# Patient Record
Sex: Male | Born: 1965 | Race: Black or African American | Hispanic: No | Marital: Married | Smoking: Never smoker
Health system: Southern US, Community
[De-identification: ages and names within clinical notes are randomized; demographics above are authoritative.]

## PROBLEM LIST (undated history)

## (undated) DIAGNOSIS — I10 Essential (primary) hypertension: Secondary | ICD-10-CM

## (undated) HISTORY — PX: TONSILLECTOMY: SUR1361

---

## 2010-02-14 ENCOUNTER — Ambulatory Visit (HOSPITAL_BASED_OUTPATIENT_CLINIC_OR_DEPARTMENT_OTHER): Admission: RE | Admit: 2010-02-14 | Discharge: 2010-02-14 | Payer: Self-pay | Admitting: Internal Medicine

## 2010-02-14 ENCOUNTER — Ambulatory Visit: Payer: Self-pay | Admitting: Diagnostic Radiology

## 2010-03-08 ENCOUNTER — Emergency Department (HOSPITAL_BASED_OUTPATIENT_CLINIC_OR_DEPARTMENT_OTHER): Admission: EM | Admit: 2010-03-08 | Discharge: 2010-03-08 | Payer: Self-pay | Admitting: Emergency Medicine

## 2012-09-28 ENCOUNTER — Emergency Department (HOSPITAL_BASED_OUTPATIENT_CLINIC_OR_DEPARTMENT_OTHER)
Admission: EM | Admit: 2012-09-28 | Discharge: 2012-09-28 | Disposition: A | Discharge status: Home / Self Care | Payer: Managed Care, Other (non HMO) | Attending: Emergency Medicine | Admitting: Emergency Medicine

## 2012-09-28 ENCOUNTER — Encounter (HOSPITAL_BASED_OUTPATIENT_CLINIC_OR_DEPARTMENT_OTHER): Payer: Self-pay | Admitting: *Deleted

## 2012-09-28 ENCOUNTER — Emergency Department (HOSPITAL_BASED_OUTPATIENT_CLINIC_OR_DEPARTMENT_OTHER): Payer: Managed Care, Other (non HMO)

## 2012-09-28 DIAGNOSIS — J3489 Other specified disorders of nose and nasal sinuses: Secondary | ICD-10-CM | POA: Insufficient documentation

## 2012-09-28 DIAGNOSIS — R059 Cough, unspecified: Secondary | ICD-10-CM | POA: Insufficient documentation

## 2012-09-28 DIAGNOSIS — I10 Essential (primary) hypertension: Secondary | ICD-10-CM | POA: Insufficient documentation

## 2012-09-28 DIAGNOSIS — J209 Acute bronchitis, unspecified: Secondary | ICD-10-CM | POA: Insufficient documentation

## 2012-09-28 DIAGNOSIS — J40 Bronchitis, not specified as acute or chronic: Secondary | ICD-10-CM

## 2012-09-28 DIAGNOSIS — R05 Cough: Secondary | ICD-10-CM | POA: Insufficient documentation

## 2012-09-28 HISTORY — DX: Essential (primary) hypertension: I10

## 2012-09-28 MED ORDER — ALBUTEROL SULFATE HFA 108 (90 BASE) MCG/ACT IN AERS: 1.0000 | INHALATION_SPRAY | Freq: Four times a day (QID) | RESPIRATORY_TRACT | Status: AC | PRN

## 2012-09-28 MED ORDER — PREDNISONE 10 MG PO TABS: 20.0000 mg | ORAL_TABLET | Freq: Every day | ORAL | Status: DC

## 2012-09-28 MED ORDER — AZITHROMYCIN 250 MG PO TABS: ORAL_TABLET | ORAL | Status: DC

## 2012-09-28 NOTE — ED Provider Notes (Signed)
History     CSN: [id number]  Arrival date & time 09/28/12  1003   First MD Initiated Contact with Patient 09/28/12 1050      Chief Complaint  Patient presents with  . URI    (Consider location/radiation/quality/duration/timing/severity/associated sxs/prior treatment) Patient is a 47 y.o. male presenting with URI. The history is provided by the patient.  URI  patient here with cough and congestion x1 week. Cough has been nonproductive. No associated fever, vomiting, diarrhea. Patient has used over-the-counter medications without relief. No recent rashes. Denies any neck pain photophobia. No severe dyspnea. Symptoms have been persistent and nothing makes them better or worse Past Medical History  Diagnosis Date  . Hypertension     Past Surgical History  Procedure Laterality Date  . Tonsillectomy      No family history on file.  History  Substance Use Topics  . Smoking status: Never Smoker   . Smokeless tobacco: Not on file  . Alcohol Use: Yes      Review of Systems  All other systems reviewed and are negative.    Allergies  Review of patient's allergies indicates no known allergies.  Home Medications   Current Outpatient Rx  Name  Route  Sig  Dispense  Refill  . UNKNOWN TO PATIENT      Blood pressure med           BP 167/98  Pulse 75  Temp(Src) 98.3 F (36.8 C) (Oral)  Resp 20  SpO2 100%  Physical Exam  Nursing note and vitals reviewed. Constitutional: He is oriented to person, place, and time. He appears well-developed and well-nourished.  Non-toxic appearance. No distress.  HENT:  Head: Normocephalic and atraumatic.  Eyes: Conjunctivae, EOM and lids are normal. Pupils are equal, round, and reactive to light.  Neck: Normal range of motion. Neck supple. No tracheal deviation present. No mass present.  Cardiovascular: Normal rate, regular rhythm and normal heart sounds.  Exam reveals no gallop.   No murmur heard. Pulmonary/Chest: Effort normal  and breath sounds normal. No stridor. No respiratory distress. He has no decreased breath sounds. He has no wheezes. He has no rhonchi. He has no rales.  Abdominal: Soft. Normal appearance and bowel sounds are normal. He exhibits no distension. There is no tenderness. There is no rebound and no CVA tenderness.  Musculoskeletal: Normal range of motion. He exhibits no edema and no tenderness.  Neurological: He is alert and oriented to person, place, and time. He has normal strength. No cranial nerve deficit or sensory deficit. GCS eye subscore is 4. GCS verbal subscore is 5. GCS motor subscore is 6.  Skin: Skin is warm and dry. No abrasion and no rash noted.  Psychiatric: He has a normal mood and affect. His speech is normal and behavior is normal.    ED Course  Procedures (including critical care time)  Labs Reviewed - No data to display No results found.   No diagnosis found.    MDM  Patient to be treated for bronchitis        Toy Baker, MD 09/28/12 1214

## 2012-09-28 NOTE — ED Notes (Signed)
Cold symptoms for past week, productive cough, L side of neck tender to touch, otc meds & nyquil not helping

## 2013-04-13 IMAGING — CR DG CHEST 2V
2 series · 2 of 2 positions shown · non-contrast
Comparison: None.

CLINICAL DATA: Cough, congestion, chest pain

CHEST - 2 VIEW

[w chest pa]
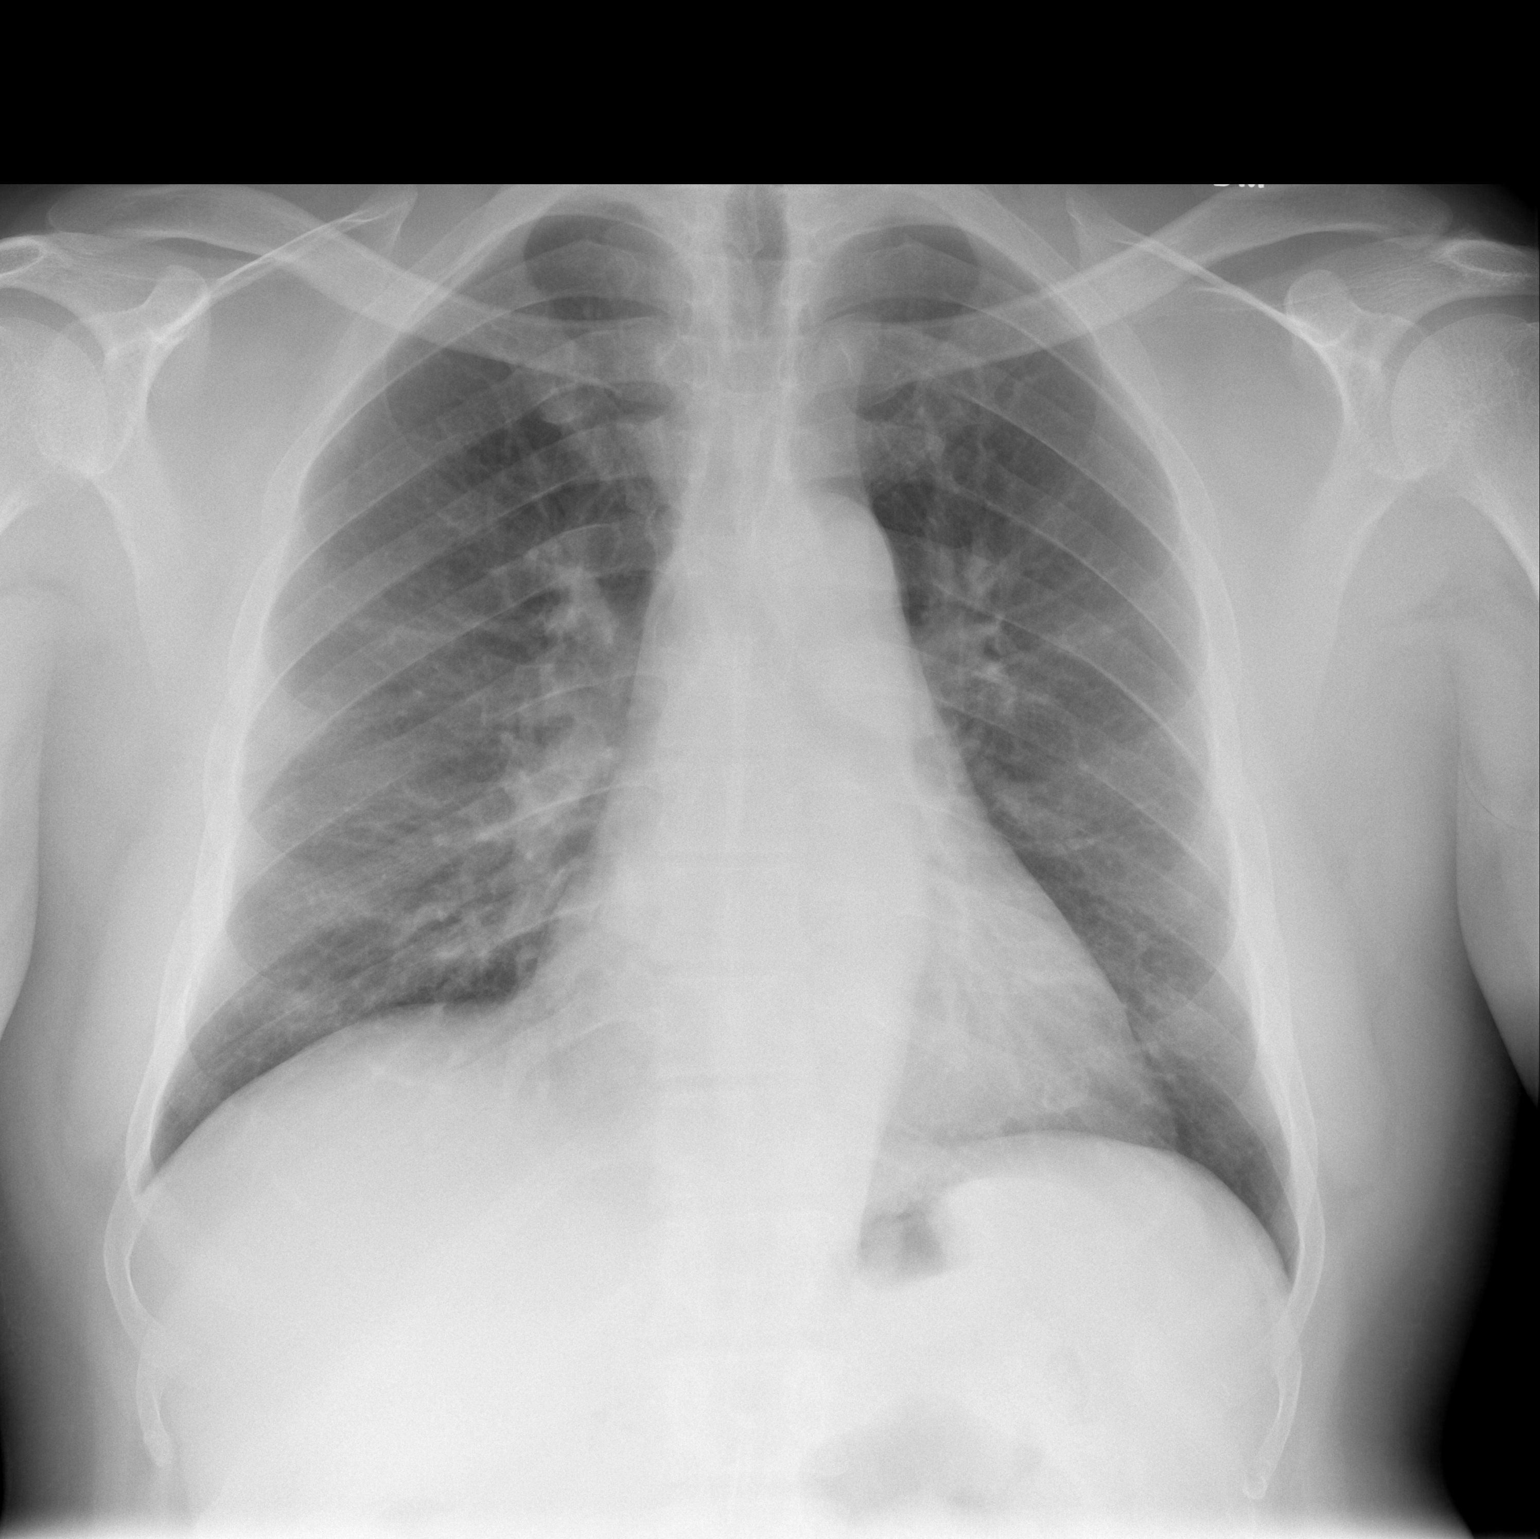

[w chest lat]
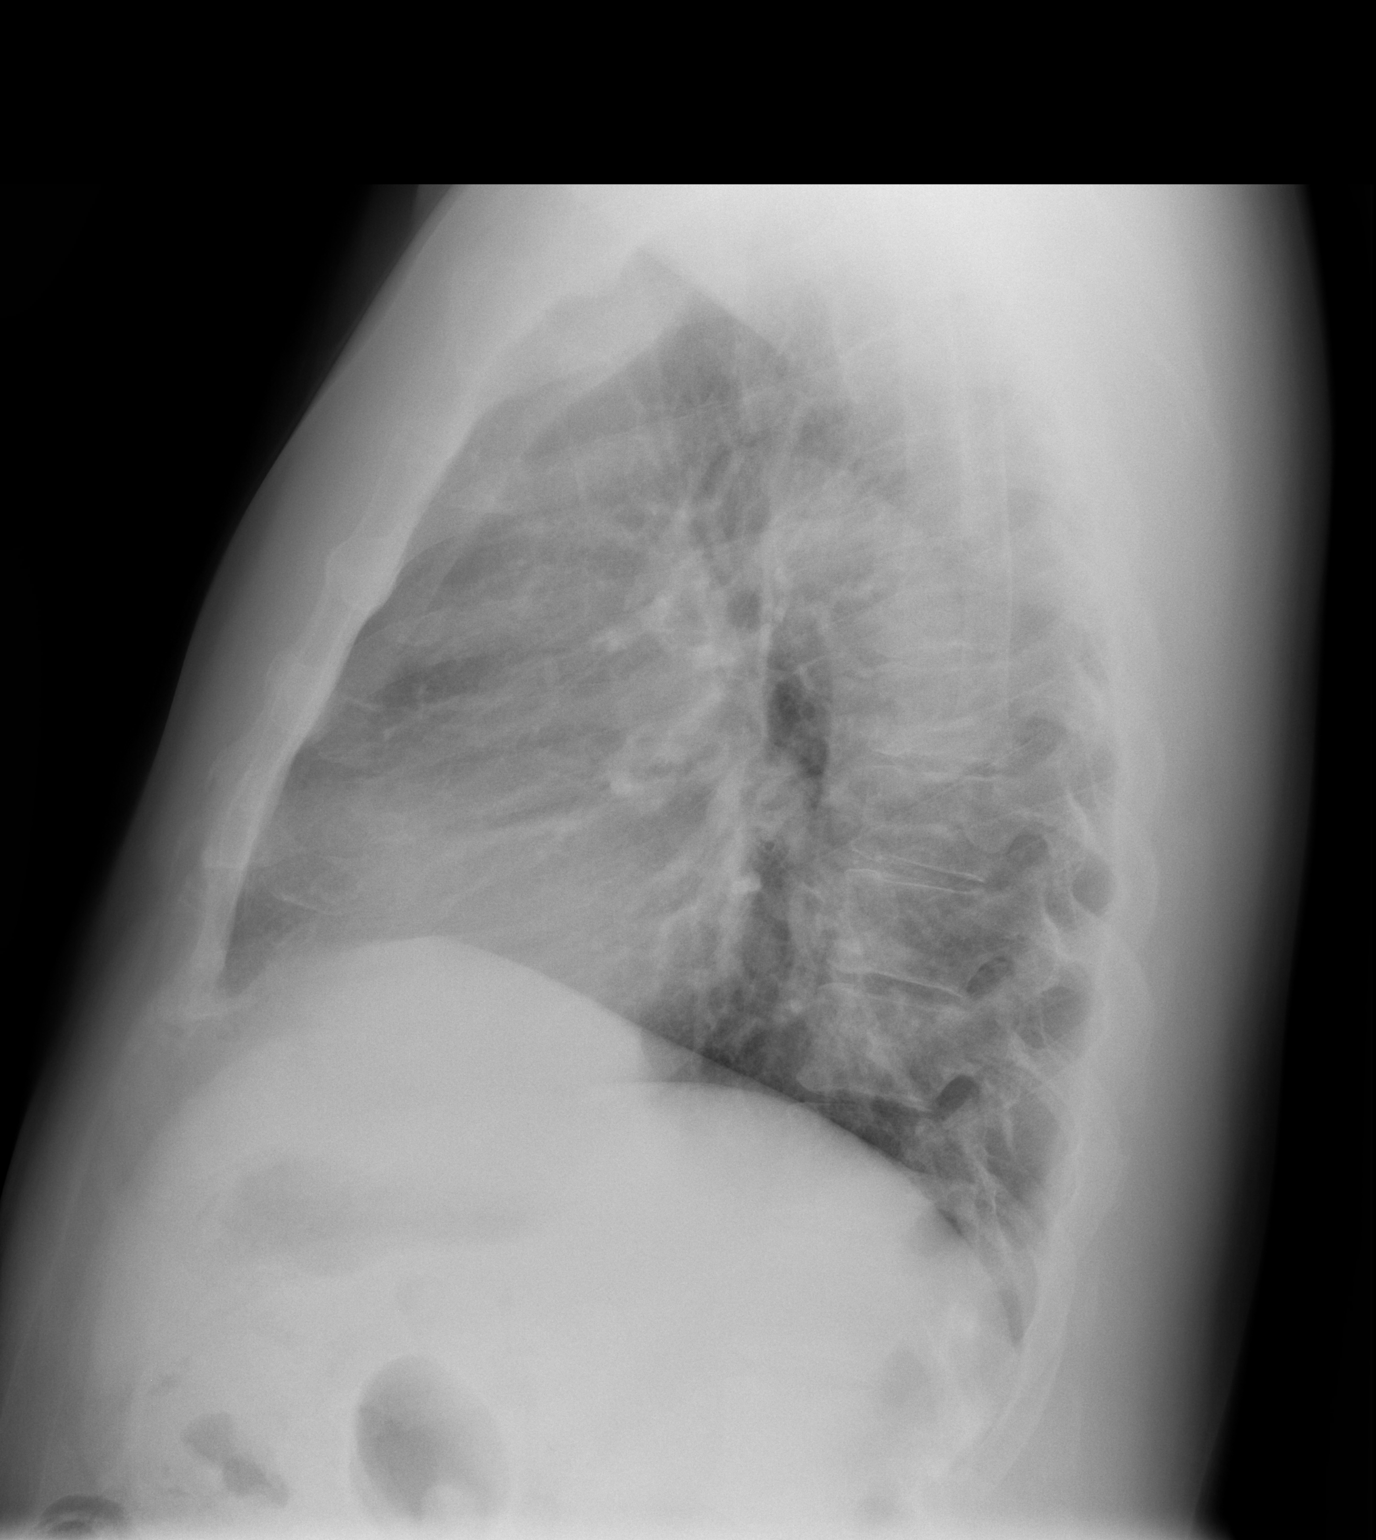

[2 of 2 positions shown; findings below may reference images not displayed]

FINDINGS: Increased interstitial markings/bronchitic changes.  No
focal consolidation. No pleural effusion or pneumothorax.

The heart is normal in size.

Visualized osseous structures are within normal limits.
IMPRESSION: Bronchitic changes.

## 2013-09-01 ENCOUNTER — Encounter (HOSPITAL_BASED_OUTPATIENT_CLINIC_OR_DEPARTMENT_OTHER): Payer: Self-pay | Admitting: Emergency Medicine

## 2013-09-01 ENCOUNTER — Emergency Department (HOSPITAL_BASED_OUTPATIENT_CLINIC_OR_DEPARTMENT_OTHER)
Admission: EM | Admit: 2013-09-01 | Discharge: 2013-09-01 | Disposition: A | Discharge status: Home / Self Care | Payer: Self-pay | Attending: Emergency Medicine | Admitting: Emergency Medicine

## 2013-09-01 ENCOUNTER — Emergency Department (HOSPITAL_BASED_OUTPATIENT_CLINIC_OR_DEPARTMENT_OTHER): Payer: Self-pay

## 2013-09-01 DIAGNOSIS — Z792 Long term (current) use of antibiotics: Secondary | ICD-10-CM | POA: Insufficient documentation

## 2013-09-01 DIAGNOSIS — R0982 Postnasal drip: Secondary | ICD-10-CM | POA: Insufficient documentation

## 2013-09-01 DIAGNOSIS — Z79899 Other long term (current) drug therapy: Secondary | ICD-10-CM | POA: Insufficient documentation

## 2013-09-01 DIAGNOSIS — J3489 Other specified disorders of nose and nasal sinuses: Secondary | ICD-10-CM | POA: Insufficient documentation

## 2013-09-01 DIAGNOSIS — R5381 Other malaise: Secondary | ICD-10-CM | POA: Insufficient documentation

## 2013-09-01 DIAGNOSIS — R5383 Other fatigue: Secondary | ICD-10-CM

## 2013-09-01 DIAGNOSIS — IMO0002 Reserved for concepts with insufficient information to code with codable children: Secondary | ICD-10-CM | POA: Insufficient documentation

## 2013-09-01 DIAGNOSIS — I10 Essential (primary) hypertension: Secondary | ICD-10-CM | POA: Insufficient documentation

## 2013-09-01 DIAGNOSIS — J40 Bronchitis, not specified as acute or chronic: Secondary | ICD-10-CM | POA: Insufficient documentation

## 2013-09-01 MED ORDER — HYDROCODONE-HOMATROPINE 5-1.5 MG/5ML PO SYRP: 5.0000 mL | ORAL_SOLUTION | Freq: Four times a day (QID) | ORAL | Status: DC | PRN

## 2013-09-01 NOTE — ED Notes (Signed)
Pt amb to room 9 with quick steady gait in nad. Pt reports cough x 1 week producing yellow sputum, pt states he has chest "soreness, only when I cough..." pt initially declines ekg, after encouragement allows ekg while being triaged. Pt denies any cp at this time, states "It's only when I cough".

## 2013-09-01 NOTE — Discharge Instructions (Signed)

## 2013-09-01 NOTE — ED Provider Notes (Signed)
CSN: [id number]     Arrival date & time 09/01/13  1141 History   First MD Initiated Contact with Patient 09/01/13 1201     Chief Complaint  Patient presents with  . chest "sore with coughing only"      (Consider location/radiation/quality/duration/timing/severity/associated sxs/prior Treatment) HPI Comments: Patient presents with cough. He states about a week ago he started having some runny nose and nasal congestion. He states it's moved down into his chest. He has a productive cough. He denies any shortness of breath. He does have some pain to the Center of his chest only with coughing. He denies a shortness of breath. He denies any fevers or chills. He denies any nausea vomiting or diarrhea. He denies a history of underlying lung disease.   Past Medical History  Diagnosis Date  . Hypertension    Past Surgical History  Procedure Laterality Date  . Tonsillectomy     History reviewed. No pertinent family history. History  Substance Use Topics  . Smoking status: Never Smoker   . Smokeless tobacco: Not on file  . Alcohol Use: Yes    Review of Systems  Constitutional: Positive for fatigue. Negative for fever, chills and diaphoresis.  HENT: Positive for congestion, postnasal drip and rhinorrhea. Negative for sneezing.   Eyes: Negative.   Respiratory: Positive for cough. Negative for chest tightness and shortness of breath.   Cardiovascular: Negative for chest pain and leg swelling.  Gastrointestinal: Negative for nausea, vomiting, abdominal pain, diarrhea and blood in stool.  Genitourinary: Negative for frequency, hematuria, flank pain and difficulty urinating.  Musculoskeletal: Negative for arthralgias and back pain.  Skin: Negative for rash.  Neurological: Negative for dizziness, speech difficulty, weakness, numbness and headaches.      Allergies  Review of patient's allergies indicates no known allergies.  Home Medications   Current Outpatient Rx  Name  Route  Sig   Dispense  Refill  . albuterol (PROVENTIL HFA;VENTOLIN HFA) 108 (90 BASE) MCG/ACT inhaler   Inhalation   Inhale 1-2 puffs into the lungs every 6 (six) hours as needed for wheezing.   1 Inhaler   0   . azithromycin (ZITHROMAX Z-PAK) 250 MG tablet      2 by mouth daily x1 then 1 by mouth daily x4 days   6 each   0   . HYDROcodone-homatropine (HYCODAN) 5-1.5 MG/5ML syrup   Oral   Take 5 mLs by mouth every 6 (six) hours as needed for cough.   120 mL   0   . predniSONE (DELTASONE) 10 MG tablet   Oral   Take 2 tablets (20 mg total) by mouth daily.   10 tablet   0   . UNKNOWN TO PATIENT      Blood pressure med          BP 153/99  Pulse 92  Temp(Src) 98.4 F (36.9 C) (Oral)  Resp 20  Ht 6\' 4"  (1.93 m)  Wt 245 lb (111.131 kg)  BMI 29.83 kg/m2  SpO2 100% Physical Exam  Constitutional: He is oriented to person, place, and time. He appears well-developed and well-nourished.  HENT:  Head: Normocephalic and atraumatic.  Eyes: Pupils are equal, round, and reactive to light.  Neck: Normal range of motion. Neck supple.  Cardiovascular: Normal rate, regular rhythm and normal heart sounds.   Pulmonary/Chest: Effort normal and breath sounds normal. No respiratory distress. He has no wheezes. He has no rales. He exhibits no tenderness.  Abdominal: Soft. Bowel sounds are normal.  There is no tenderness. There is no rebound and no guarding.  Musculoskeletal: Normal range of motion. He exhibits no edema.  Lymphadenopathy:    He has no cervical adenopathy.  Neurological: He is alert and oriented to person, place, and time.  Skin: Skin is warm and dry. No rash noted.  Psychiatric: He has a normal mood and affect.    ED Course  Procedures (including critical care time) Labs Review Labs Reviewed - No data to display Imaging Review Dg Chest 2 View  09/01/2013   CLINICAL DATA:  Cough and congestion  EXAM: CHEST  2 VIEW  COMPARISON:  09/28/2012  FINDINGS: Cardiac shadow is stable. The  lungs are well aerated bilaterally. Mild bronchitic changes are again identified. No focal confluent infiltrate is seen. No acute bony abnormality is noted.  IMPRESSION: Bronchitic changes.  No focal infiltrate is seen.   Electronically Signed   By: Alcide CleverMark  Lukens M.D.   On: 09/01/2013 12:41    EKG Interpretation    Date/Time:  Monday September 01 2013 11:53:40 EST Ventricular Rate:  86 PR Interval:  160 QRS Duration: 86 QT Interval:  368 QTC Calculation: 440 R Axis:   45 Text Interpretation:  Normal sinus rhythm Normal ECG No old tracing to compare Confirmed by Nyja Westbrook  MD, Karmella Bouvier ([id number]) on 09/01/2013 12:13:52 PM            MDM   Final diagnoses:  Bronchitis    Patient presents with one-week history of cough. His lungs are clear. There is no evidence of pneumonia on exam her chest x-ray. He's afebrile. He has normal oxygen saturation. He was given a prescription for Hycodan cough syrup and encouraged to followup with her primary care physician if his symptoms are not improving. He was given a resource guide for possible followup.    Rolan BuccoMelanie Adolfo Granieri, MD 09/01/13 1314

## 2014-03-17 IMAGING — CR DG CHEST 2V
2 series · 2 of 2 positions shown · non-contrast
Comparison: 09/28/2012

CLINICAL DATA: Cough and congestion

EXAM:
CHEST  2 VIEW

[w chest pa]
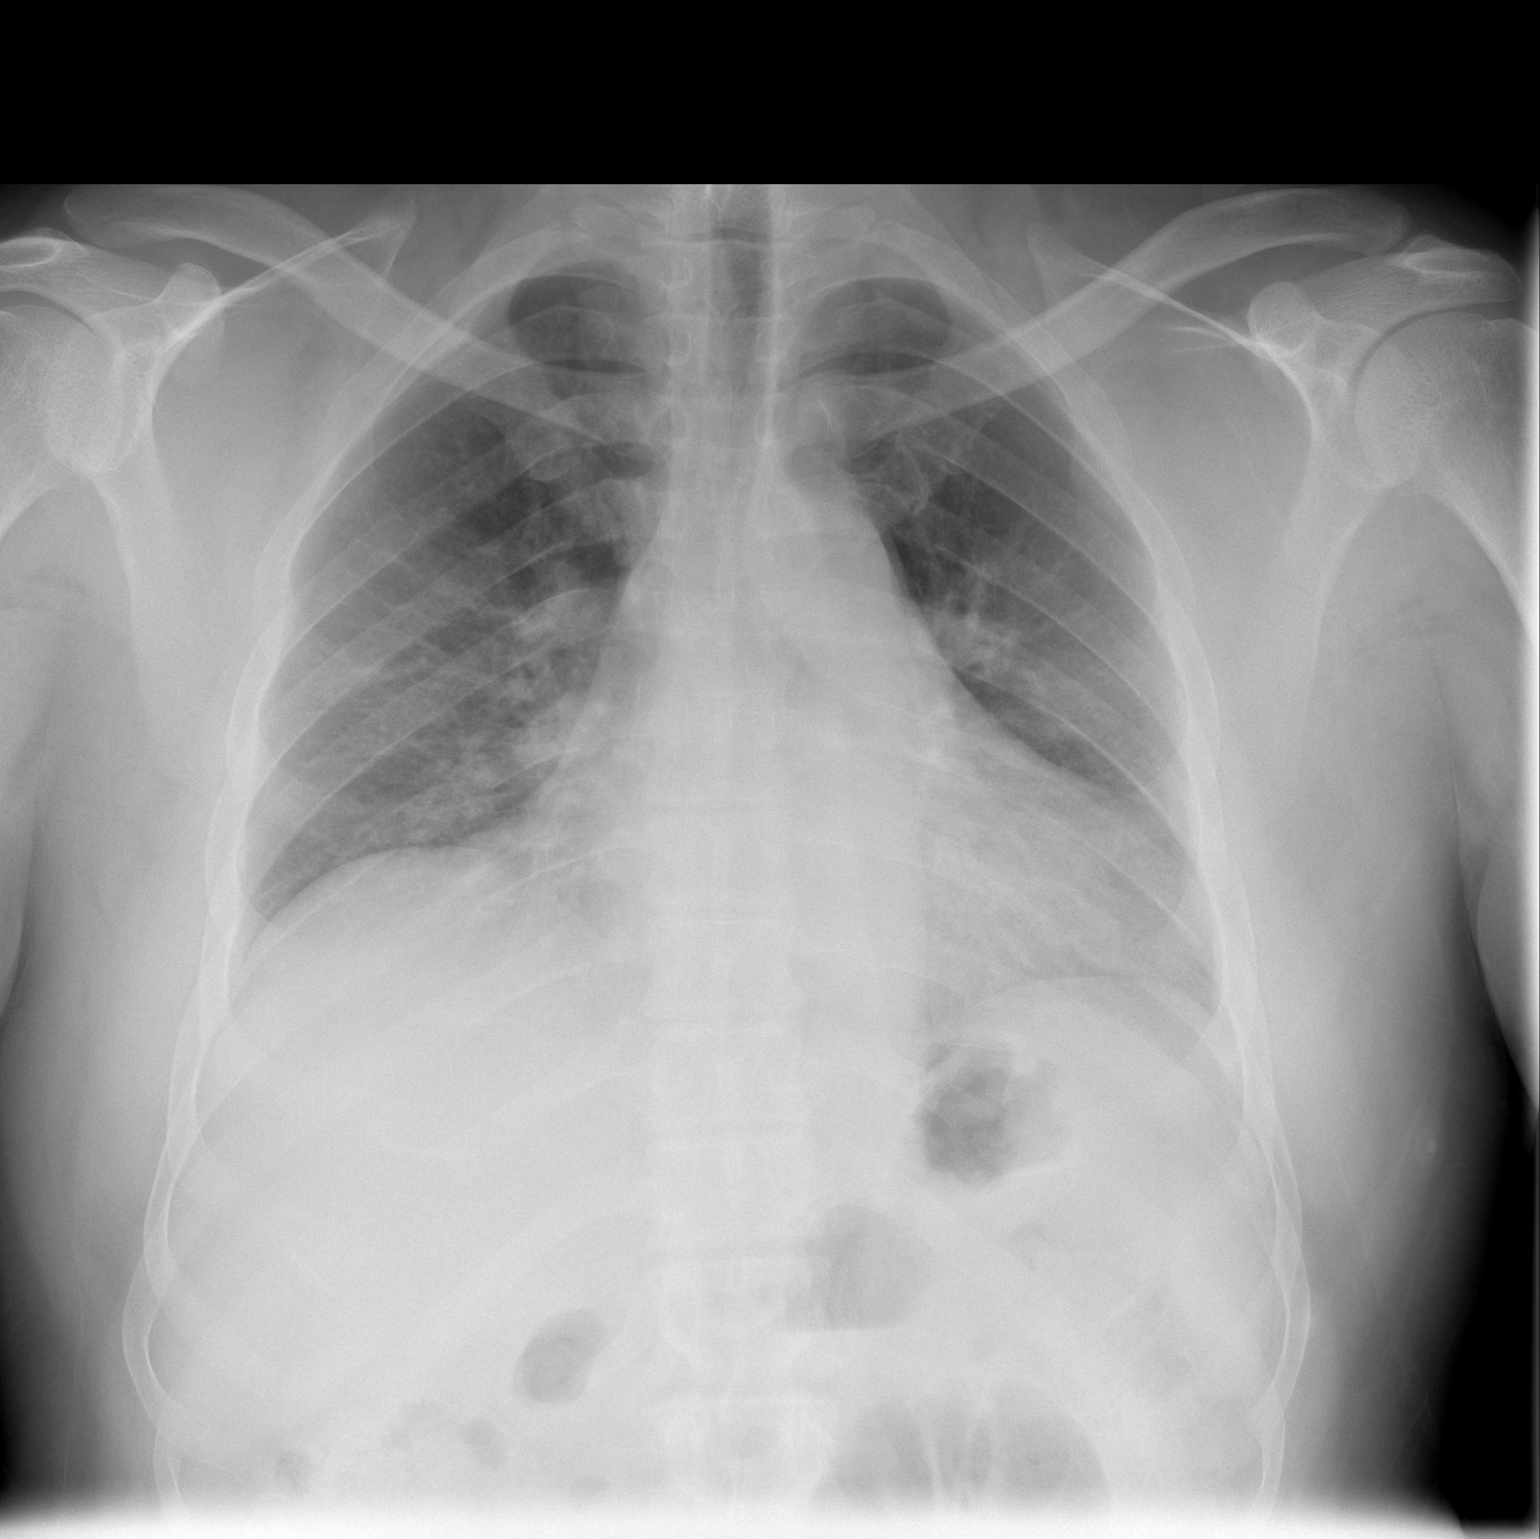

[w chest lat]
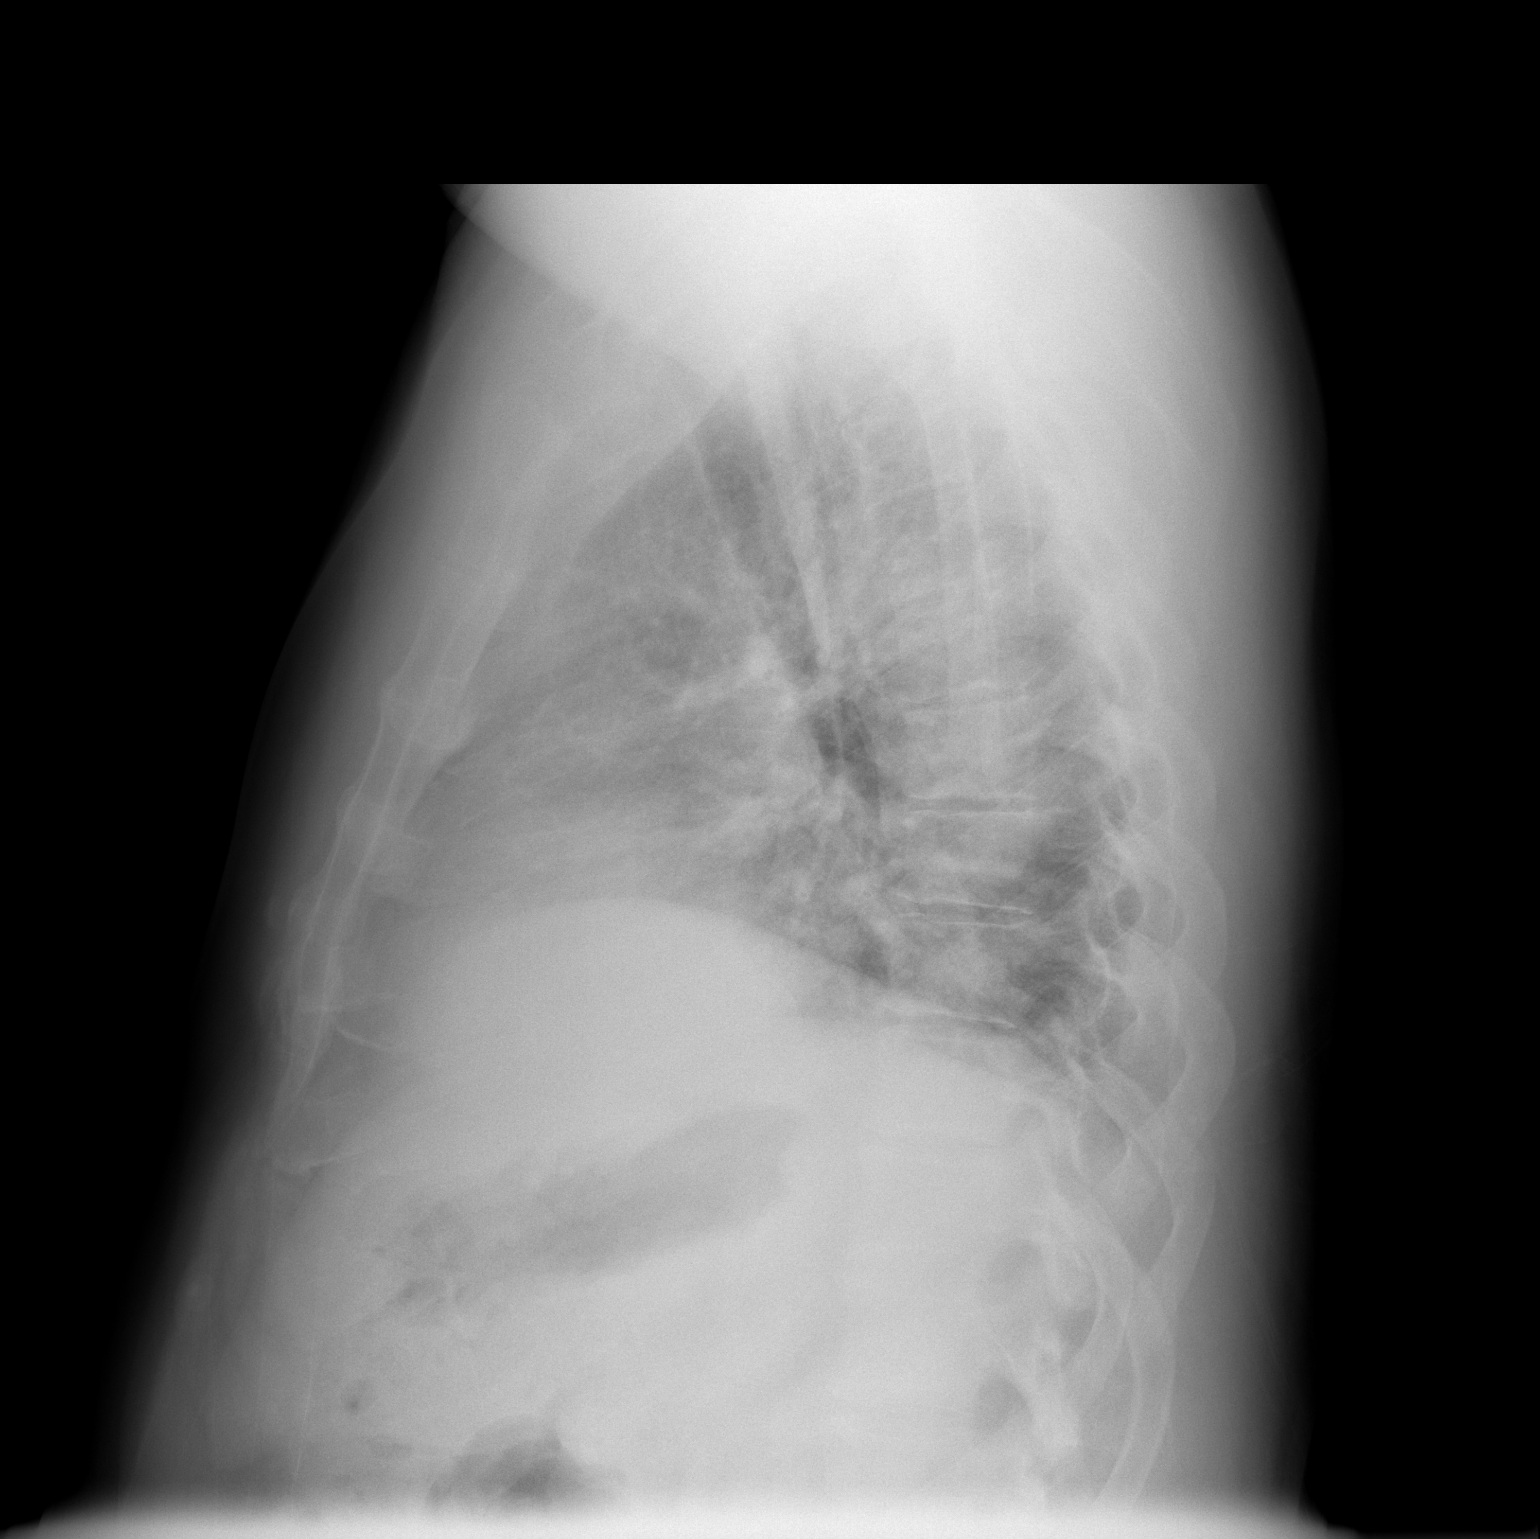

[2 of 2 positions shown; findings below may reference images not displayed]

FINDINGS: Cardiac shadow is stable. The lungs are well aerated bilaterally.
Mild bronchitic changes are again identified. No focal confluent
infiltrate is seen. No acute bony abnormality is noted.
IMPRESSION: Bronchitic changes.  No focal infiltrate is seen.

## 2018-05-06 ENCOUNTER — Other Ambulatory Visit: Payer: Self-pay

## 2018-05-06 ENCOUNTER — Emergency Department (HOSPITAL_BASED_OUTPATIENT_CLINIC_OR_DEPARTMENT_OTHER)
Admission: EM | Admit: 2018-05-06 | Discharge: 2018-05-06 | Disposition: A | Discharge status: Home / Self Care | Payer: PRIVATE HEALTH INSURANCE | Attending: Emergency Medicine | Admitting: Emergency Medicine

## 2018-05-06 ENCOUNTER — Encounter (HOSPITAL_BASED_OUTPATIENT_CLINIC_OR_DEPARTMENT_OTHER): Payer: Self-pay

## 2018-05-06 DIAGNOSIS — G510 Bell's palsy: Secondary | ICD-10-CM | POA: Diagnosis not present

## 2018-05-06 DIAGNOSIS — R29810 Facial weakness: Secondary | ICD-10-CM | POA: Diagnosis present

## 2018-05-06 DIAGNOSIS — I10 Essential (primary) hypertension: Secondary | ICD-10-CM | POA: Insufficient documentation

## 2018-05-06 MED ORDER — PREDNISONE 20 MG PO TABS: 40.0000 mg | ORAL_TABLET | Freq: Every day | ORAL | 0 refills | Status: DC

## 2018-05-06 MED ORDER — VALACYCLOVIR HCL 1 G PO TABS: 1000.0000 mg | ORAL_TABLET | Freq: Three times a day (TID) | ORAL | 0 refills | Status: DC

## 2018-05-06 NOTE — Discharge Instructions (Signed)
Make sure you are filing a strict diabetic diet as the prednisone will increase your blood sugar.  If you develop any symptoms of numbness or weakness 10 arm or leg, difficulty speaking or walking return immediately

## 2018-05-06 NOTE — ED Notes (Signed)
Right facial droop. Right eyebrow droop with raising eye brows.  Pain free . Steady gait. Speech clear.

## 2018-05-06 NOTE — ED Notes (Signed)
ED Provider at bedside. 

## 2018-05-06 NOTE — ED Provider Notes (Signed)
[facility] EMERGENCY DEPARTMENT Provider Note   CSN: [id number] Arrival date & time: 05/06/18  1846     History   Chief Complaint Chief Complaint  Patient presents with  . Facial Droop    HPI Jeffery Schroeder is a 52 y.o. male.  Patient is a 52 year old male with a history of hypertension, borderline diabetes on metformin presenting today with a strange feeling in his face and weakness.  States yesterday he had a weird tingling sensation in the right side of his face and an unusual taste in his mouth.  At this morning when he woke up he noticed his face was drooping.  When he smiles only the left corner of his mouth moves.  He denies any aphasia, numbness or tingling in the arm or leg unilaterally.  He has had no dizziness or difficulty ambulating.  Last week had URI sx but have improved.  No other illnesses.  The history is provided by the patient.    Past Medical History:  Diagnosis Date  . Hypertension     There are no active problems to display for this patient.   Past Surgical History:  Procedure Laterality Date  . TONSILLECTOMY          Home Medications    Prior to Admission medications   Medication Sig Start Date End Date Taking? Authorizing Provider  albuterol (PROVENTIL HFA;VENTOLIN HFA) 108 (90 BASE) MCG/ACT inhaler Inhale 1-2 puffs into the lungs every 6 (six) hours as needed for wheezing. 09/28/12   Lorre Nick, MD  azithromycin (ZITHROMAX Z-PAK) 250 MG tablet 2 by mouth daily x1 then 1 by mouth daily x4 days 09/28/12   Lorre Nick, MD  HYDROcodone-homatropine [facility]) 5-1.5 MG/5ML syrup Take 5 mLs by mouth every 6 (six) hours as needed for cough. 09/01/13   Rolan Bucco, MD  predniSONE (DELTASONE) 20 MG tablet Take 2 tablets (40 mg total) by mouth daily. 05/06/18   Gwyneth Sprout, MD  UNKNOWN TO PATIENT Blood pressure med    [provider]  valACYclovir (VALTREX) 1000 MG tablet Take 1 tablet (1,000 mg total) by mouth 3 (three) times  daily. 05/06/18   Gwyneth Sprout, MD    Family History No family history on file.  Social History Social History   Tobacco Use  . Smoking status: Never Smoker  . Smokeless tobacco: Never Used  Substance Use Topics  . Alcohol use: Yes    Comment: occ  . Drug use: No     Allergies   Patient has no known allergies.   Review of Systems Review of Systems  All other systems reviewed and are negative.    Physical Exam Updated Vital Signs BP (!) 145/74   Pulse 91   Temp 97.8 F (36.6 C) (Oral)   Resp (!) 21   SpO2 99%   Physical Exam  Constitutional: He is oriented to person, place, and time. He appears well-developed and well-nourished. No distress.  HENT:  Head: Normocephalic and atraumatic.  Eyes: Pupils are equal, round, and reactive to light. EOM are normal.  Neck: Normal range of motion.  Cardiovascular: Normal rate, regular rhythm, normal heart sounds and intact distal pulses.  No murmur heard. Pulmonary/Chest: Effort normal and breath sounds normal.  Abdominal: Soft. He exhibits no distension and no mass. There is no tenderness. There is no guarding.  Musculoskeletal: He exhibits no edema or tenderness.  Neurological: He is alert and oriented to person, place, and time. He has normal strength. No sensory deficit. Coordination  and gait normal.  Right sided facial droop with forehead involvement.  No sensory deficits.  Normal tongue movement and sensation.  No vesicular rashes.  Eye is able to completely close but mild weakness of right eyelid.  Skin: Skin is warm. Capillary refill takes less than 2 seconds.  Psychiatric: He has a normal mood and affect. His behavior is normal.  Nursing note and vitals reviewed.    ED Treatments / Results  Labs (all labs ordered are listed, but only abnormal results are displayed) Labs Reviewed - No data to display  EKG None  Radiology No results found.  Procedures Procedures (including critical care  time)  Medications Ordered in ED Medications - No data to display   Initial Impression / Assessment and Plan / ED Course  I have reviewed the triage vital signs and the nursing notes.  Pertinent labs & imaging results that were available during my care of the patient were reviewed by me and considered in my medical decision making (see chart for details).     Patient presenting today with right-sided facial droop most consistent with Bell's palsy.  Patient is otherwise well-appearing.  No other neurologic findings.  Low suspicion for stroke at this time.  Findings discussed with the patient and his wife.  Will start on prednisone and Valtrex.  Patient had last creatinine a year ago and it was 1.1.  He is diabetic and takes metformin and stressed to him following a strict diabetic diet drinking plenty of water and knowing that his blood sugar would be elevated based on the prednisone.  Recommended he follow-up with his doctor in 1 week  Final Clinical Impressions(s) / ED Diagnoses   Final diagnoses:  Bell's palsy    ED Discharge Orders         Ordered    predniSONE (DELTASONE) 20 MG tablet  Daily     05/06/18 1943    valACYclovir (VALTREX) 1000 MG tablet  3 times daily     05/06/18 1943           Gwyneth Sprout, MD 05/06/18 2300

## 2018-05-06 NOTE — ED Triage Notes (Signed)
Pt woke this am 6am with facial droop -states "mouth felt funny yesterday"-NAD-steady gait

## 2019-03-15 ENCOUNTER — Emergency Department (HOSPITAL_COMMUNITY): Payer: PRIVATE HEALTH INSURANCE

## 2019-03-15 ENCOUNTER — Encounter (HOSPITAL_COMMUNITY): Payer: Self-pay | Admitting: Emergency Medicine

## 2019-03-15 ENCOUNTER — Emergency Department (HOSPITAL_COMMUNITY)
Admission: EM | Admit: 2019-03-15 | Discharge: 2019-03-15 | Disposition: A | Discharge status: Home / Self Care | Payer: PRIVATE HEALTH INSURANCE | Attending: Emergency Medicine | Admitting: Emergency Medicine

## 2019-03-15 ENCOUNTER — Other Ambulatory Visit: Payer: Self-pay

## 2019-03-15 DIAGNOSIS — Z7982 Long term (current) use of aspirin: Secondary | ICD-10-CM | POA: Diagnosis not present

## 2019-03-15 DIAGNOSIS — U071 COVID-19: Secondary | ICD-10-CM | POA: Insufficient documentation

## 2019-03-15 DIAGNOSIS — R05 Cough: Secondary | ICD-10-CM | POA: Diagnosis present

## 2019-03-15 DIAGNOSIS — I10 Essential (primary) hypertension: Secondary | ICD-10-CM | POA: Diagnosis not present

## 2019-03-15 LAB — CBC WITH DIFFERENTIAL/PLATELET
Abs Immature Granulocytes: 0.01 10*3/uL (ref 0.00–0.07)
Basophils Absolute: 0 10*3/uL (ref 0.0–0.1)
Basophils Relative: 0 %
Eosinophils Absolute: 0 10*3/uL (ref 0.0–0.5)
Eosinophils Relative: 0 %
HCT: 50.2 % (ref 39.0–52.0)
Hemoglobin: 16.3 g/dL (ref 13.0–17.0)
Immature Granulocytes: 0 %
Lymphocytes Relative: 26 %
Lymphs Abs: 1.4 10*3/uL (ref 0.7–4.0)
MCH: 28 pg (ref 26.0–34.0)
MCHC: 32.5 g/dL (ref 30.0–36.0)
MCV: 86.1 fL (ref 80.0–100.0)
Monocytes Absolute: 0.4 10*3/uL (ref 0.1–1.0)
Monocytes Relative: 6 %
Neutro Abs: 3.6 10*3/uL (ref 1.7–7.7)
Neutrophils Relative %: 68 %
Platelets: 172 10*3/uL (ref 150–400)
RBC: 5.83 MIL/uL — ABNORMAL HIGH (ref 4.22–5.81)
RDW: 12.7 % (ref 11.5–15.5)
WBC: 5.5 10*3/uL (ref 4.0–10.5)
nRBC: 0 % (ref 0.0–0.2)

## 2019-03-15 LAB — BASIC METABOLIC PANEL
Anion gap: 15 (ref 5–15)
BUN: 14 mg/dL (ref 6–20)
CO2: 22 mmol/L (ref 22–32)
Calcium: 9.3 mg/dL (ref 8.9–10.3)
Chloride: 101 mmol/L (ref 98–111)
Creatinine, Ser: 1.09 mg/dL (ref 0.61–1.24)
GFR calc Af Amer: >60 mL/min (ref >60)
GFR calc non Af Amer: >60 mL/min (ref >60)
Glucose, Bld: 126 mg/dL — ABNORMAL HIGH (ref 70–99)
Potassium: 3.8 mmol/L (ref 3.5–5.1)
Sodium: 138 mmol/L (ref 135–145)

## 2019-03-15 MED ORDER — SODIUM CHLORIDE 0.9 % IV BOLUS
1000.0000 mL | Freq: Once | INTRAVENOUS | Status: AC
  Administered 2019-03-15: 1000 mL via INTRAVENOUS

## 2019-03-15 MED ORDER — KETOROLAC TROMETHAMINE 15 MG/ML IJ SOLN
15.0000 mg | Freq: Once | INTRAMUSCULAR | Status: AC
  Administered 2019-03-15: 15 mg via INTRAVENOUS
  Filled 2019-03-15: qty 1

## 2019-03-15 NOTE — Discharge Instructions (Signed)
Please call your primary doctor to schedule virtual recheck next week.  If you develop worsening difficulty breathing, vomiting, inability tolerate fluids, please return to ER for reassessment.

## 2019-03-15 NOTE — ED Triage Notes (Signed)
Patient arrived by self from home. Pt c/o headache, fever, and SOB.   Pt started having S/S Friday. Pt tested COVID positive on Friday at [place] per pt statement.   Pt doesn't appear to be in distress.

## 2019-03-15 NOTE — ED Provider Notes (Signed)
[place] DEPT Provider Note   CSN: [id number] Arrival date & time: 03/15/19  1821     History   Chief Complaint Chief Complaint  Patient presents with  . Shortness of Breath    HPI Jeffery Schroeder is a 53 y.o. male.  Just presents [facility] department chief complaint flulike illness.  Patient states symptoms have been going on for approximately 1 week.  Went to clinic on August 17 in urgent care and was tested for COVID-19.  States that his test came back positive.  Patient has been isolating at home with his wife who also has COVID-19.  States symptoms have been moderate in severity, primarily myalgias, cough, fevers.  Has been taking Tylenol at home.  Denies significant difficulty in breathing, cough has been nonproductive.  States he has had some nausea but no vomiting or diarrhea.     HPI  Past Medical History:  Diagnosis Date  . Hypertension     There are no active problems to display for this patient.   Past Surgical History:  Procedure Laterality Date  . TONSILLECTOMY          Home Medications    Prior to Admission medications   Medication Sig Start Date End Date Taking? Authorizing Provider  acetaminophen (TYLENOL) 325 MG tablet Take 650 mg by mouth every 6 (six) hours as needed for mild pain, fever or headache.   Yes [provider]  albuterol (PROVENTIL HFA;VENTOLIN HFA) 108 (90 BASE) MCG/ACT inhaler Inhale 1-2 puffs into the lungs every 6 (six) hours as needed for wheezing. 09/28/12  Yes Lacretia Leigh, MD  amLODipine (NORVASC) 10 MG tablet Take 10 mg by mouth daily. 12/23/18  Yes [provider]  aspirin EC 81 MG tablet Take 81 mg by mouth daily. 11/16/16  Yes [provider]  hydrochlorothiazide (MICROZIDE) 12.5 MG capsule Take 12.5 mg by mouth daily. 12/31/18  Yes [provider]  ibuprofen (ADVIL) 200 MG tablet Take 400 mg by mouth every 6 (six) hours as needed for fever or moderate pain.   Yes  [provider]  lisinopril (ZESTRIL) 40 MG tablet Take 40 mg by mouth daily. 12/31/18  Yes [provider]    Family History History reviewed. No pertinent family history.  Social History Social History   Tobacco Use  . Smoking status: Never Smoker  . Smokeless tobacco: Never Used  Substance Use Topics  . Alcohol use: Yes    Comment: occ  . Drug use: No     Allergies   Patient has no known allergies.   Review of Systems Review of Systems  Constitutional: Positive for chills and fever.  HENT: Negative for ear pain and sore throat.   Eyes: Negative for pain and visual disturbance.  Respiratory: Positive for cough and shortness of breath.   Cardiovascular: Negative for chest pain and palpitations.  Gastrointestinal: Negative for abdominal pain and vomiting.  Genitourinary: Negative for dysuria and hematuria.  Musculoskeletal: Negative for arthralgias and back pain.  Skin: Negative for color change and rash.  Neurological: Negative for seizures and syncope.  All other systems reviewed and are negative.    Physical Exam Updated Vital Signs BP (!) 156/91   Pulse 92   Temp 98.3 F (36.8 C) (Oral)   Resp (!) 25   Ht 6\' 4"  (1.93 m)   Wt 111.1 kg   SpO2 96%   BMI 29.82 kg/m   Physical Exam Vitals signs and nursing note reviewed.  Constitutional:  Appearance: He is well-developed.  HENT:     Head: Normocephalic and atraumatic.  Eyes:     Conjunctiva/sclera: Conjunctivae normal.  Neck:     Musculoskeletal: Neck supple.  Cardiovascular:     Rate and Rhythm: Normal rate and regular rhythm.     Heart sounds: No murmur.  Pulmonary:     Effort: Pulmonary effort is normal. No tachypnea, accessory muscle usage or respiratory distress.     Breath sounds: Normal breath sounds.  Chest:     Chest wall: No mass or deformity.  Abdominal:     Palpations: Abdomen is soft.     Tenderness: There is no abdominal tenderness.  Musculoskeletal:     Right  lower leg: No edema.     Left lower leg: No edema.  Skin:    General: Skin is warm and dry.     Capillary Refill: Capillary refill takes less than 2 seconds.  Neurological:     General: No focal deficit present.     Mental Status: He is alert and oriented to person, place, and time.  Psychiatric:        Mood and Affect: Mood normal.        Behavior: Behavior normal.      ED Treatments / Results  Labs (all labs ordered are listed, but only abnormal results are displayed) Labs Reviewed  CBC WITH DIFFERENTIAL/PLATELET - Abnormal; Notable for the following components:      Result Value   RBC 5.83 (*)    All other components within normal limits  BASIC METABOLIC PANEL - Abnormal; Notable for the following components:   Glucose, Bld 126 (*)    All other components within normal limits    EKG None  Radiology Dg Chest Portable 1 View  Result Date: 03/15/2019 CLINICAL DATA:  Shortness of breath, COVID-19 positive EXAM: PORTABLE CHEST 1 VIEW COMPARISON:  09/01/2013 FINDINGS: The heart size and mediastinal contours are within normal limits. Subtle heterogeneous opacities bilaterally, most conspicuous in the right midlung. The visualized skeletal structures are unremarkable. IMPRESSION: Subtle heterogeneous opacities bilaterally, most conspicuous in the right midlung, generally consistent with COVID-19 airspace disease. Electronically Signed   By: Lauralyn PrimesAlex  Bibbey M.D.   On: 03/15/2019 19:33    Procedures Procedures (including critical care time)  Medications Ordered in ED Medications  sodium chloride 0.9 % bolus 1,000 mL (0 mLs Intravenous Stopped 03/15/19 2056)  ketorolac (TORADOL) 15 MG/ML injection 15 mg (15 mg Intravenous Given 03/15/19 1951)     Initial Impression / Assessment and Plan / ED Course  I have reviewed the triage vital signs and the nursing notes.  Pertinent labs & imaging results that were available during my care of the patient were reviewed by me and considered in  my medical decision making (see chart for details).  Clinical Course as of Mar 15 2231  Sat Mar 15, 2019  2057 Reviewed labs, patient remains well-appearing, normal pulse ox, believe appropriate for outpatient management, will discharge home   [RD]    Clinical Course User Index [RD] Milagros Lollykstra, Saffron Busey S, MD       53 year old gentleman who presented to the emergency department with myalgias, cough, chills.  Recent COVID-19 diagnosis.  Believe symptoms today consistent with COVID-19 illness.  Chest x-ray without significant infiltrate.  No hypoxia, no significant tachypnea or increased work of breathing on exam.  At this time believe patient appropriate for outpatient management of his illness.  Reviewed strict return precautions though.  Will discharge home, recommended  recheck by PCP by virtual visit next week.    After the discussed management above, the patient was determined to be safe for discharge.  The patient was in agreement with this plan and all questions regarding their care were answered.  ED return precautions were discussed and the patient will return to the ED with any significant worsening of condition.    Final Clinical Impressions(s) / ED Diagnoses   Final diagnoses:  COVID-19    ED Discharge Orders    None       Milagros Lollykstra, Sherly Brodbeck S, MD 03/15/19 2232

## 2019-03-25 ENCOUNTER — Other Ambulatory Visit: Payer: Self-pay

## 2019-03-25 DIAGNOSIS — Z20822 Contact with and (suspected) exposure to covid-19: Secondary | ICD-10-CM

## 2019-03-27 LAB — NOVEL CORONAVIRUS, NAA: SARS-CoV-2, NAA: NOT DETECTED

## 2019-03-28 ENCOUNTER — Telehealth: Payer: Self-pay | Admitting: *Deleted

## 2019-03-28 NOTE — Telephone Encounter (Signed)
Pt's wife given result of COVID test; she verbalized understanding. 

## 2019-09-28 IMAGING — DX PORTABLE CHEST - 1 VIEW
1 series · 1 of 1 positions shown · non-contrast
Comparison: 09/01/2013

CLINICAL DATA: Shortness of breath, [id number] positive

EXAM:
PORTABLE CHEST 1 VIEW

[chest ap]
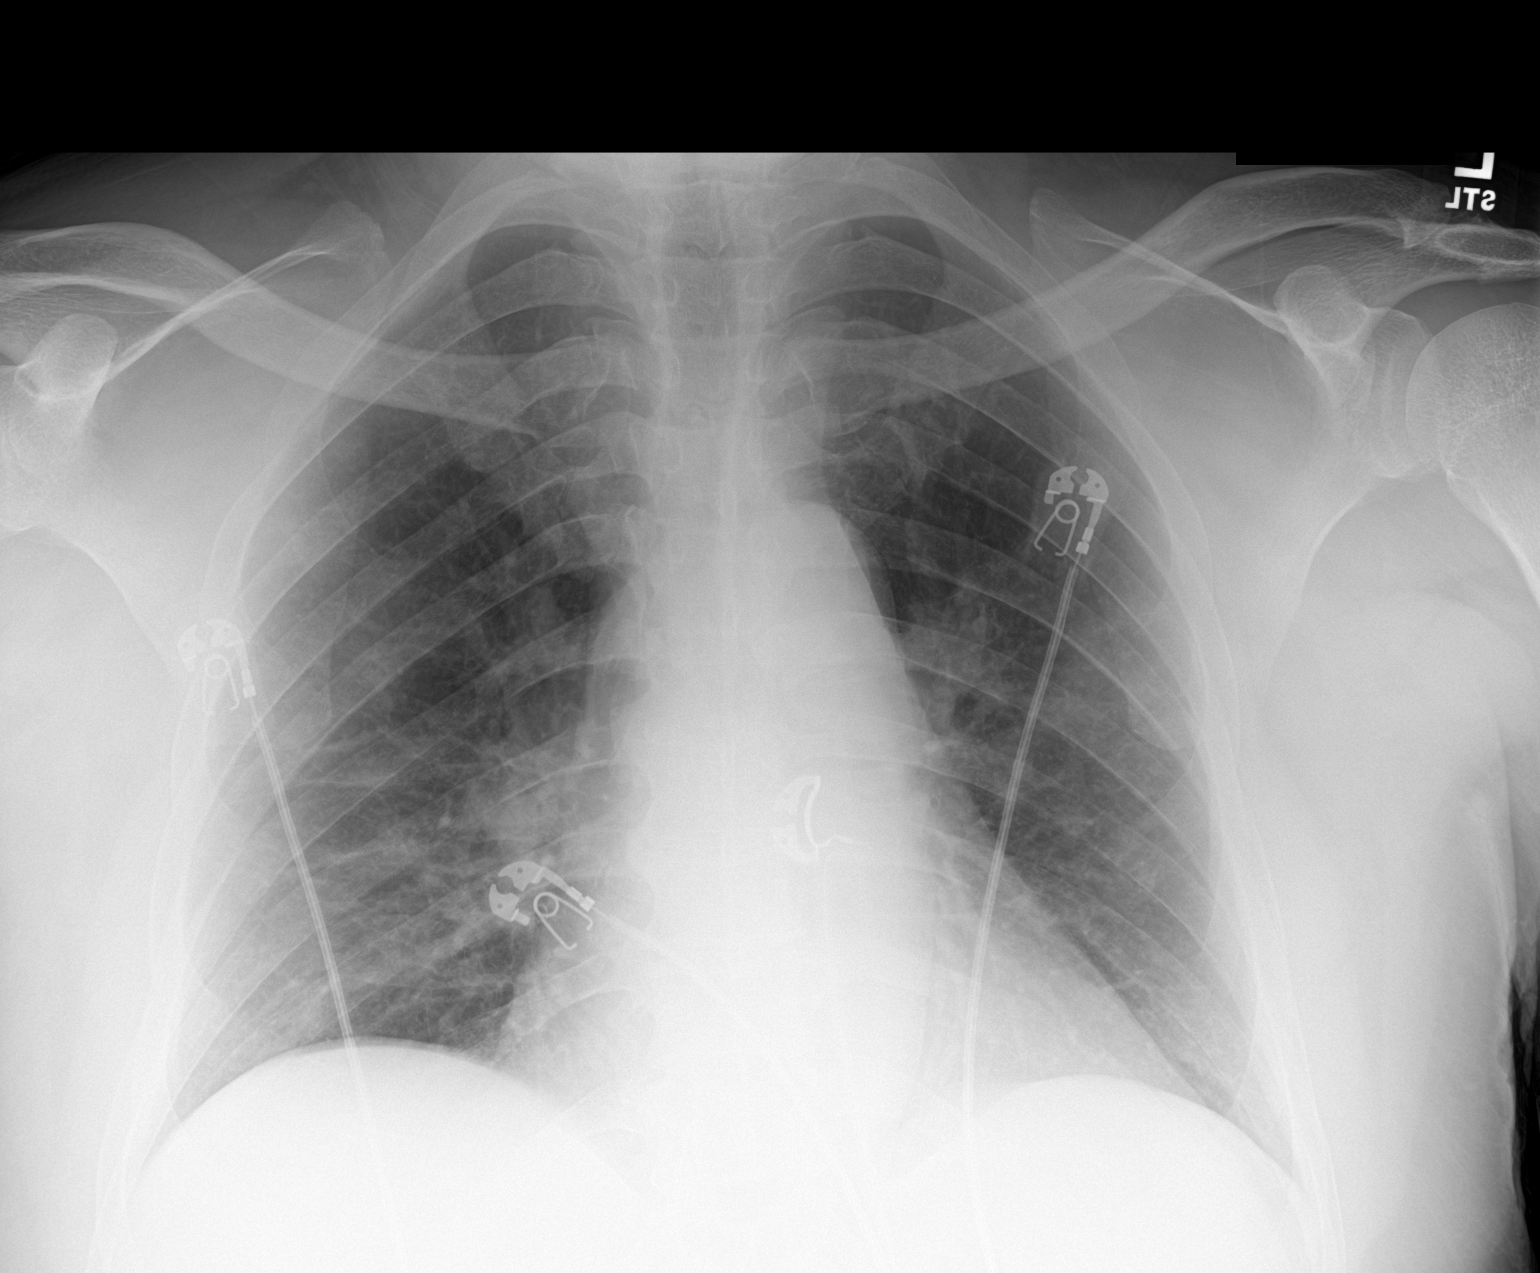

[1 of 1 positions shown; findings below may reference images not displayed]

FINDINGS: The heart size and mediastinal contours are within normal limits.
Subtle heterogeneous opacities bilaterally, most conspicuous in the
right midlung. The visualized skeletal structures are unremarkable.
IMPRESSION: Subtle heterogeneous opacities bilaterally, most conspicuous in the
right midlung, generally consistent with [id number] airspace disease.

## 2023-08-21 ENCOUNTER — Emergency Department (HOSPITAL_BASED_OUTPATIENT_CLINIC_OR_DEPARTMENT_OTHER): Payer: PRIVATE HEALTH INSURANCE

## 2023-08-21 ENCOUNTER — Encounter (HOSPITAL_BASED_OUTPATIENT_CLINIC_OR_DEPARTMENT_OTHER): Payer: Self-pay | Admitting: Emergency Medicine

## 2023-08-21 ENCOUNTER — Emergency Department (HOSPITAL_BASED_OUTPATIENT_CLINIC_OR_DEPARTMENT_OTHER)
Admission: EM | Admit: 2023-08-21 | Discharge: 2023-08-21 | Disposition: A | Discharge status: Home / Self Care | Payer: PRIVATE HEALTH INSURANCE | Attending: Emergency Medicine | Admitting: Emergency Medicine

## 2023-08-21 ENCOUNTER — Other Ambulatory Visit: Payer: Self-pay

## 2023-08-21 DIAGNOSIS — Z7982 Long term (current) use of aspirin: Secondary | ICD-10-CM | POA: Insufficient documentation

## 2023-08-21 DIAGNOSIS — R1032 Left lower quadrant pain: Secondary | ICD-10-CM | POA: Insufficient documentation

## 2023-08-21 DIAGNOSIS — Z79899 Other long term (current) drug therapy: Secondary | ICD-10-CM | POA: Diagnosis not present

## 2023-08-21 DIAGNOSIS — I10 Essential (primary) hypertension: Secondary | ICD-10-CM | POA: Insufficient documentation

## 2023-08-21 LAB — URINALYSIS, ROUTINE W REFLEX MICROSCOPIC
Bilirubin Urine: NEGATIVE
Glucose, UA: NEGATIVE mg/dL
Hgb urine dipstick: NEGATIVE
Ketones, ur: NEGATIVE mg/dL
Leukocytes,Ua: NEGATIVE
Nitrite: NEGATIVE
Protein, ur: NEGATIVE mg/dL
Specific Gravity, Urine: 1.02 (ref 1.005–1.030)
pH: 6.5 (ref 5.0–8.0)

## 2023-08-21 LAB — CBC WITH DIFFERENTIAL/PLATELET
Abs Immature Granulocytes: 0.02 10*3/uL (ref 0.00–0.07)
Basophils Absolute: 0 10*3/uL (ref 0.0–0.1)
Basophils Relative: 0 %
Eosinophils Absolute: 0.1 10*3/uL (ref 0.0–0.5)
Eosinophils Relative: 2 %
HCT: 44.9 % (ref 39.0–52.0)
Hemoglobin: 14.9 g/dL (ref 13.0–17.0)
Immature Granulocytes: 0 %
Lymphocytes Relative: 37 %
Lymphs Abs: 2.7 10*3/uL (ref 0.7–4.0)
MCH: 28.1 pg (ref 26.0–34.0)
MCHC: 33.2 g/dL (ref 30.0–36.0)
MCV: 84.6 fL (ref 80.0–100.0)
Monocytes Absolute: 0.5 10*3/uL (ref 0.1–1.0)
Monocytes Relative: 7 %
Neutro Abs: 3.9 10*3/uL (ref 1.7–7.7)
Neutrophils Relative %: 54 %
Platelets: 251 10*3/uL (ref 150–400)
RBC: 5.31 MIL/uL (ref 4.22–5.81)
RDW: 12.8 % (ref 11.5–15.5)
WBC: 7.3 10*3/uL (ref 4.0–10.5)
nRBC: 0 % (ref 0.0–0.2)

## 2023-08-21 LAB — COMPREHENSIVE METABOLIC PANEL
ALT: 19 U/L (ref 0–44)
AST: 16 U/L (ref 15–41)
Albumin: 3.8 g/dL (ref 3.5–5.0)
Alkaline Phosphatase: 57 U/L (ref 38–126)
Anion gap: 7 (ref 5–15)
BUN: 14 mg/dL (ref 6–20)
CO2: 25 mmol/L (ref 22–32)
Calcium: 10.2 mg/dL (ref 8.9–10.3)
Chloride: 102 mmol/L (ref 98–111)
Creatinine, Ser: 0.99 mg/dL (ref 0.61–1.24)
GFR, Estimated: >60 mL/min (ref >60)
Glucose, Bld: 136 mg/dL — ABNORMAL HIGH (ref 70–99)
Potassium: 3.8 mmol/L (ref 3.5–5.1)
Sodium: 134 mmol/L — ABNORMAL LOW (ref 135–145)
Total Bilirubin: 0.8 mg/dL (ref 0.0–1.2)
Total Protein: 7.3 g/dL (ref 6.5–8.1)

## 2023-08-21 LAB — LIPASE, BLOOD: Lipase: 29 U/L (ref 11–51)

## 2023-08-21 MED ORDER — HYDROCODONE-ACETAMINOPHEN 5-325 MG PO TABS: 1.0000 | ORAL_TABLET | ORAL | 0 refills | Status: AC | PRN

## 2023-08-21 MED ORDER — IBUPROFEN 800 MG PO TABS
800.0000 mg | ORAL_TABLET | Freq: Once | ORAL | Status: AC
  Administered 2023-08-21: 800 mg via ORAL
  Filled 2023-08-21: qty 1

## 2023-08-21 MED ORDER — METHOCARBAMOL 500 MG PO TABS: 500.0000 mg | ORAL_TABLET | Freq: Three times a day (TID) | ORAL | 0 refills | Status: AC | PRN

## 2023-08-21 NOTE — ED Provider Notes (Signed)
[name] [facility] AT [facility] Provider Note   CSN: [id number] Arrival date & time: 08/21/23  0915     History  Chief Complaint  Patient presents with   Flank Pain    Jeffery Schroeder is a 58 y.o. male.  Pt is a 58 yo male with pmhx significant for HTN.  Pt said he's had LLQ abd pain for the past week.  He has been taking ibuprofen without improvement.  Pt has no urinary sx.  No f/c.  No cough.         Home Medications Prior to Admission medications   Medication Sig Start Date End Date Taking? Authorizing Provider  HYDROcodone-acetaminophen (NORCO/VICODIN) 5-325 MG tablet Take 1 tablet by mouth every 4 (four) hours as needed. 08/21/23  Yes Jacalyn Lefevre, MD  methocarbamol (ROBAXIN) 500 MG tablet Take 1 tablet (500 mg total) by mouth every 8 (eight) hours as needed for muscle spasms. 08/21/23  Yes Jacalyn Lefevre, MD  acetaminophen (TYLENOL) 325 MG tablet Take 650 mg by mouth every 6 (six) hours as needed for mild pain, fever or headache.    [provider]  albuterol (PROVENTIL HFA;VENTOLIN HFA) 108 (90 BASE) MCG/ACT inhaler Inhale 1-2 puffs into the lungs every 6 (six) hours as needed for wheezing. 09/28/12   Lorre Nick, MD  amLODipine (NORVASC) 10 MG tablet Take 10 mg by mouth daily. 12/23/18   [provider]  aspirin EC 81 MG tablet Take 81 mg by mouth daily. 11/16/16   [provider]  hydrochlorothiazide (MICROZIDE) 12.5 MG capsule Take 12.5 mg by mouth daily. 12/31/18   [provider]  ibuprofen (ADVIL) 200 MG tablet Take 400 mg by mouth every 6 (six) hours as needed for fever or moderate pain.    [provider]  lisinopril (ZESTRIL) 40 MG tablet Take 40 mg by mouth daily. 12/31/18   [provider]      Allergies    Patient has no known allergies.    Review of Systems   Review of Systems  Gastrointestinal:  Positive for abdominal pain.  All other systems reviewed and are negative.   Physical  Exam Updated Vital Signs BP (!) 145/72   Pulse 90   Temp 97.8 F (36.6 C) (Oral)   Resp 17   Ht 6\' 4"  (1.93 m)   Wt 113.4 kg   SpO2 98%   BMI 30.43 kg/m  Physical Exam Vitals and nursing note reviewed.  Constitutional:      Appearance: Normal appearance. He is obese.  HENT:     Head: Normocephalic and atraumatic.     Right Ear: External ear normal.     Left Ear: External ear normal.     Nose: Nose normal.     Mouth/Throat:     Mouth: Mucous membranes are moist.     Pharynx: Oropharynx is clear.  Eyes:     Extraocular Movements: Extraocular movements intact.     Conjunctiva/sclera: Conjunctivae normal.     Pupils: Pupils are equal, round, and reactive to light.  Cardiovascular:     Rate and Rhythm: Normal rate and regular rhythm.     Pulses: Normal pulses.  Pulmonary:     Effort: Pulmonary effort is normal.     Breath sounds: Normal breath sounds.  Abdominal:     General: Abdomen is flat. Bowel sounds are normal.     Palpations: Abdomen is soft.  Musculoskeletal:        General: Normal range of  motion.     Cervical back: Normal range of motion and neck supple.  Skin:    General: Skin is warm.     Capillary Refill: Capillary refill takes less than 2 seconds.  Neurological:     General: No focal deficit present.     Mental Status: He is alert and oriented to person, place, and time.  Psychiatric:        Mood and Affect: Mood normal.        Behavior: Behavior normal.        Thought Content: Thought content normal.     ED Results / Procedures / Treatments   Labs (all labs ordered are listed, but only abnormal results are displayed) Labs Reviewed  COMPREHENSIVE METABOLIC PANEL - Abnormal; Notable for the following components:      Result Value   Sodium 134 (*)    Glucose, Bld 136 (*)    All other components within normal limits  URINALYSIS, ROUTINE W REFLEX MICROSCOPIC  CBC WITH DIFFERENTIAL/PLATELET  LIPASE, BLOOD    EKG None  Radiology CT Renal Stone  Study Result Date: 08/21/2023 CLINICAL DATA:  Abdominal pain.  Concern for kidney stone. EXAM: CT ABDOMEN AND PELVIS WITHOUT CONTRAST TECHNIQUE: Multidetector CT imaging of the abdomen and pelvis was performed following the standard protocol without IV contrast. RADIATION DOSE REDUCTION: This exam was performed according to the departmental dose-optimization program which includes automated exposure control, adjustment of the mA and/or kV according to patient size and/or use of iterative reconstruction technique. COMPARISON:  None Available. FINDINGS: Evaluation of this exam is limited in the absence of intravenous contrast. Lower chest: The visualized lung bases are clear. No intra-abdominal free air or free fluid. Hepatobiliary: The liver is unremarkable. No biliary ductal dilatation. The gallbladder is unremarkable. Pancreas: Unremarkable. No pancreatic ductal dilatation or surrounding inflammatory changes. Spleen: Normal in size without focal abnormality. Adrenals/Urinary Tract: The adrenal glands unremarkable. The kidneys, visualized ureters, and urinary bladder appear unremarkable. Stomach/Bowel: There is no bowel obstruction or active inflammation. No evidence of acute appendicitis. Vascular/Lymphatic: Mild atherosclerotic calcification of the abdominal aorta. The IVC is unremarkable. No portal venous gas. There is no adenopathy. Reproductive: The prostate and seminal vesicles are grossly unremarkable. No pelvic mass. Other: Small fat containing umbilical hernia. Musculoskeletal: No acute or significant osseous findings. IMPRESSION: 1. No acute intra-abdominal or pelvic pathology. No hydronephrosis or nephrolithiasis. 2. No bowel obstruction. 3.  Aortic Atherosclerosis (ICD10-I70.0). Electronically Signed   By: Elgie Collard M.D.   On: 08/21/2023 13:11    Procedures Procedures    Medications Ordered in ED Medications  ibuprofen (ADVIL) tablet 800 mg (800 mg Oral Given 08/21/23 1154)    ED  Course/ Medical Decision Making/ A&P                                 Medical Decision Making Amount and/or Complexity of Data Reviewed Labs: ordered.  Risk Prescription drug management.   This patient presents to the ED for concern of llq abd pain, this involves an extensive number of treatment options, and is a complaint that carries with it a high risk of complications and morbidity.  The differential diagnosis includes kidney stone, pyelo, diverticulitis, msk   Co morbidities that complicate the patient evaluation  htn   Additional history obtained:  Additional history obtained from epic chart review  Lab Tests:  I Ordered, and personally interpreted labs.  The pertinent results  include:  ua nl; cmp nl, cbc nl   Imaging Studies ordered:  I ordered imaging studies including ct renal  I independently visualized and interpreted imaging which showed   No acute intra-abdominal or pelvic pathology. No hydronephrosis  or nephrolithiasis.  2. No bowel obstruction.  3.  Aortic Atherosclerosis (ICD10-I70.0).   I agree with the radiologist interpretation   Medicines ordered and prescription drug management:   I have reviewed the patients home medicines and have made adjustments as needed   Test Considered:  CT  Problem List / ED Course:  Left flank pain:  no rash, ct and labs are nl.  Etiology unclear, but nothing life threatening.  Pt is stable for d/c.  Return if worse.  F/u with pcp.   Reevaluation:  After the interventions noted above, I reevaluated the patient and found that they have :improved   Social Determinants of Health:  Lives at home   Dispostion:  After consideration of the diagnostic results and the patients response to treatment, I feel that the patent would benefit from discharge with outpatient f/u.          Final Clinical Impression(s) / ED Diagnoses Final diagnoses:  Left lower quadrant abdominal pain    Rx / DC Orders ED  Discharge Orders          Ordered    methocarbamol (ROBAXIN) 500 MG tablet  Every 8 hours PRN        08/21/23 1418    HYDROcodone-acetaminophen (NORCO/VICODIN) 5-325 MG tablet  Every 4 hours PRN        08/21/23 1418              Jacalyn Lefevre, MD 08/21/23 1516

## 2023-08-21 NOTE — ED Provider Triage Note (Signed)
Emergency Medicine Provider Triage Evaluation Note  Jeffery Schroeder , a 58 y.o. male  was evaluated in triage.  Pt complains of intermittent left sided flank pain that started a week ago. Pain has become constant for 2-3 days. Pain described as 8/10 pressure.  Takes ibuprofen at home for pain. Last time last night  No trauma, heavy lifting, urinary symptoms, fevers, no hx of kidney stones  Review of Systems  Positive: Left sided pain Negative: No trauma, heavy lifting, urinary symptoms, fevers, no hx of kidney stones  Physical Exam  BP (!) 164/99 (BP Location: Right Arm)   Pulse 88   Temp 98.7 F (37.1 C) (Oral)   Resp 18   Ht 6\' 4"  (1.93 m)   Wt 113.4 kg   SpO2 99%   BMI 30.43 kg/m  Gen:   Awake, no distress   Resp:  Normal effort  MSK:   Moves extremities without difficulty  Other:  TTP of left flank  Medical Decision Making  Medically screening exam initiated at 11:46 AM.  Appropriate orders placed.  Jeffery Schroeder was informed that the remainder of the evaluation will be completed by another provider, this initial triage assessment does not replace that evaluation, and the importance of remaining in the ED until their evaluation is complete.  UA neg for inx, hgb Pending stone study   Judithann Sheen, PA 08/21/23 1150

## 2023-08-21 NOTE — ED Triage Notes (Signed)
C/o left sided flank pain x 2 days. Denies urinary issues. No hx of kidney stones.
